# Patient Record
Sex: Female | Born: 1965 | Race: White | Hispanic: No | Marital: Married
Health system: Midwestern US, Academic
[De-identification: ages and names within clinical notes are randomized; demographics above are authoritative.]

---

## 2017-09-01 ENCOUNTER — Encounter: Admit: 2017-09-01 | Discharge: 2017-09-01 | Payer: BC Managed Care – PPO

## 2017-09-01 ENCOUNTER — Ambulatory Visit: Admit: 2017-09-01 | Discharge: 2017-09-01 | Payer: BC Managed Care – PPO

## 2017-09-01 DIAGNOSIS — Z87898 Personal history of other specified conditions: ICD-10-CM

## 2017-09-01 DIAGNOSIS — R351 Nocturia: ICD-10-CM

## 2017-09-01 DIAGNOSIS — N3946 Mixed incontinence: ICD-10-CM

## 2017-09-01 DIAGNOSIS — N952 Postmenopausal atrophic vaginitis: ICD-10-CM

## 2017-09-01 DIAGNOSIS — N8110 Cystocele, unspecified: ICD-10-CM

## 2017-09-01 DIAGNOSIS — N816 Rectocele: ICD-10-CM

## 2017-09-01 DIAGNOSIS — R3989 Other symptoms and signs involving the genitourinary system: Principal | ICD-10-CM

## 2017-09-01 DIAGNOSIS — R030 Elevated blood-pressure reading, without diagnosis of hypertension: ICD-10-CM

## 2017-09-01 DIAGNOSIS — N941 Unspecified dyspareunia: ICD-10-CM

## 2017-09-01 DIAGNOSIS — Z87440 Personal history of urinary (tract) infections: ICD-10-CM

## 2017-09-01 NOTE — Progress Notes
Pertinent Review of Systems:  - General ROS: DeniesN recent weight change above 10 pounds, DeniesPOS malaise  - HEENT ROS: DeniesN blurring of vision. DeniesN double vision, DeniesN glaucoma, DeniesN dry eyes  - Cardiovascular ROS: DeniesN chest pain. DeniesN palpitations currently. DeniesN orthopnea  - Respiratory ROS: DeniesN shortness of breath,  DeniesN cough,  DeniesN wheezing currently  - Gastrointestinal ROS: DeniesN constipation, DeniesN gastric reflux,  DeniesN blood in stool, DeniesN diarrhea, DeniesN Irritable bowel syndrome, DeniesN nausea, DeniesN vomiting, DeniesN abdominal pain  - Endocrine ROS: DeniesPOS polydipsia,  DeniesN excessive sweating. DeniesN hot flashes. DeniesPOS Thyroid disease  - Hematological and Lymphatic ROS: DeniesN easy bruisability , DeniesPOS current anemia, DeniesN adenopathy in the inguinal area  - Neurological ROS: DeniesN syncope, DeniesN numbness in lower extremities, DeniesPOS neuropathy. DeniesN shooting pain down the legs, DeniesPOS shooting pain in the lower back, DeniesPOS low back pain  - Musculoskeletal ROS: DeniesPOS Joint pain. DeniesN Edema in lower extremities. DeniesN fibromyalgia  - Psychological ROS: DeniesPOS depression, DeniesN anxiety, DeniesN thoughts of suicide, DeniesN thoughts of homicide, DeniesN recent seizure, DeniesN syncope   - Dermatological ROS: DeniesN eczema. DeniesN skin rashes in the groin area and other sites

## 2017-09-01 NOTE — Progress Notes
CHIEF COMPLAINT:   Chief Complaint   Patient presents with   ??? Bladder infection     New Patient        Natasha Guzman is a 51 y.o., female who presents for consultation   at the request of Dr Tommi Emery  for an opinion regarding (reason for visit): recurrent UTIs.     Pertinent History  Patient presents for chief complaint of recurrent UTIs which started in the past year.  She has had 5 UTIs in the past year.  She denies a lifetime history of recurrent UTIs.  She denies hematuria and dysuria.  She complains of suprapubic pain/pressure, rated 5 out of 10, occurring about every 4-6 weeks.  This pain is typically associated with UTI.  She was most recently on an antibiotic for UTI about 3 or 4 weeks ago.  She denies a history of pyelonephritis.   She voids 8-10 times per day.  Complains of nocturia twice nightly.  She drinks 1-2 servings of caffeine per day.  She complains of urinary leakage, occurring equally with stress and urgency.  She leaks urine a few times per month, usually drops at a time.  She does not wear incontinence pads daily.    Patient also complains of trouble emptying her bladder.  This started about 1 year ago.  She describes this as feeling of incomplete bladder emptying, requiring her to sit on the toilet for about 10 minutes and keep trying to urinate in order to feel as if she empties completely.  She denies intermittent stream, spraying, positional voiding.  She feels an increased awareness of bladder sensations.      She denies seeing or feeling of vaginal bulge.  She is not complaining of pelvic support problems.  She does not splint to urinate or defecate.     She denies diarrhea, constipation, pain with defecation, straining to defecate, fecal urgency, fecal incontinence and rectal prolapse. With defecation, she admits repeated wiping to feel clean.    She complains of dyspareunia, both with insertion and deep thrust.  She describes razor blade pain vaginally and periurethrally with intercourse and sometimes not associated with intercourse.  She admits vaginal dryness.  She admits a decreased desire for intercourse.  Of note, she states her PCP told her her vaginal canal was shortened.    She has seen one other physician for these complaints.  She has not had any treatment for these complaints.    Surgical history  Gastric bypass  Bilateral shoulder arthroscopy  Bilateral carpal tunnel release  Tubal ligation 1990  TAH plus BSO in 1995 (for cysts of ovaries, fallopian tubes and menorrhagia)    Gynecologic history  G4P4, vaginal deliveries, had episiotomy and laceration.    Last Pap smear 3-4 weeks ago.  Patient states there was an infection.  She was given antibiotics unsure which one.   Last colonoscopy November 2018 was normal      Pertinent Review of Systems:  - General ROS: DeniesN recent weight change above 10 pounds, DeniesPOS malaise  - HEENT ROS: DeniesN blurring of vision. DeniesN double vision, DeniesN glaucoma, DeniesN dry eyes  - Cardiovascular ROS: DeniesN chest pain. DeniesN palpitations currently. DeniesN orthopnea  - Respiratory ROS: DeniesN shortness of breath,  DeniesN cough,  DeniesN wheezing currently  - Gastrointestinal ROS: DeniesN constipation, DeniesN gastric reflux,  DeniesN blood in stool, DeniesN diarrhea, DeniesN Irritable bowel syndrome, DeniesN nausea, DeniesN vomiting, DeniesN abdominal pain  - Endocrine ROS: POS polydipsia,  DeniesN  excessive sweating. DeniesN hot flashes. POS Thyroid disease  - Hematological and Lymphatic ROS: DeniesN easy bruisability , POS current anemia, DeniesN adenopathy in the inguinal area  - Neurological ROS: DeniesN syncope, DeniesN numbness in lower extremities, DeniesPOS neuropathy. DeniesN shooting pain down the legs, DeniesPOS shooting pain in the lower back, DeniesPOS low back pain  - Musculoskeletal ROS: DeniesPOS Joint pain. DeniesN Edema in lower extremities. DeniesN fibromyalgia  - Psychological ROS: DeniesPOS depression, DeniesN anxiety, DeniesN thoughts of suicide, DeniesN thoughts of homicide, DeniesN recent seizure, DeniesN syncope   - Dermatological ROS: DeniesN eczema. DeniesN skin rashes in the groin area and other sites        ___________________________________________________________________________        ALLERGIES:  Keflex [cephalexin]    History reviewed. No pertinent past medical history.    Past Surgical History:   Procedure Laterality Date   ??? HX HYSTERECTOMY     ??? OOPHORECTOMY         Family History   Problem Relation Age of Onset   ??? Heart Failure Mother    ??? Heart Failure Father        Social History     Socioeconomic History   ??? Marital status: Married     Spouse name: Not on file   ??? Number of children: Not on file   ??? Years of education: Not on file   ??? Highest education level: Not on file   Social Needs   ??? Financial resource strain: Not on file   ??? Food insecurity - worry: Not on file   ??? Food insecurity - inability: Not on file   ??? Transportation needs - medical: Not on file   ??? Transportation needs - non-medical: Not on file   Occupational History   ??? Not on file   Tobacco Use   ??? Smoking status: Not on file   Substance and Sexual Activity   ??? Alcohol use: Not on file   ??? Drug use: Not on file   ??? Sexual activity: Yes   Other Topics Concern   ??? Not on file   Social History Narrative   ??? Not on file         Current Outpatient Medications:   ???  levothyroxine (SYNTHROID) 125 mcg tablet, Take 125 mcg by mouth daily 30 minutes before breakfast., Disp: , Rfl:   ???  other medication, 1 Dose. CBD Oil 10 drops BID, Disp: , Rfl:   ???  venlafaxine XR (EFFEXOR XR) 150 mg capsule, Take 150 mg by mouth daily. Take with food., Disp: , Rfl:       ___________________________________________________________________________        OBJECTIVE:    BP (!) 146/98 (BP Source: Arm, Left Upper, Patient Position: Sitting)  - Pulse 110  - Ht 172.7 cm (68)  - Wt 10.3 kg (22 lb 12.8 oz)  - BMI 3.47 kg/m???   BP recheck: 149/98, pulse 74    Physical Examination:  General appearance: not in acute distress, walking with normal gait  Mental status :oriented to time, place and person; affect and mood appropriate; normal interaction  Cardiovascular: normal heart rate, bilateral LE pulses palpable  Chest / Respiratory: normal respiratory effort, unlabored breathing   Back: No spine tenderness, no SI joint tenderness, No CVAT   Neck: No thryromegally, no adenopathy  Gastrointestinal / Abdomen: no masses, no rebound, no tenderness, soft, nondistended, no hernias,  incisions without tenderness   Extremities: no gross deformities, normal joint mobility  Neruologic: no asymmetric weakness in LE, no abnormalities in sensation in LE  Peripheral vascular system: no cyanosis, no edema in LE, extremities warm  Dermatological: no perineal nevi or rashes noted   Genitourinary:      Voids (ml)  Void (spontaneous)       PVR by bladder scan 57 mL        Urinalysis  Leuks                 pos      Nitrites                 neg      Heme                 neg                      (one choice per column)  Ext. Genitalia      Lesions noted No    Appearance consistent with age Yes   Vagina       Discharge Present No    Mucosa Atrophic Yes    Tissue Friable No    Tissue Tender To Palpation. No    Bladder      Masses Noted No    Tender to Palpation See below   Urethra      Midline Yes    Normal Size Yes    Urethral Tenderness On Palpation See below    Urethral Discharge Noted No    Any Lesions Noted. No    Uterus        Normal Size S/P HYST    Mobile S/P HYST    Tender S/P HYST   Adnexa       Masses palpated  S/P BSO    Tenderness noted S/P BSO   Bimanual       Masses palpated  None   Perineum       visually intact. Yes    Anus  Rectocele reproducible with digital exam     External Hemorrhoids none    Anal tone IAS 4/5, EAS 4/5   Myofascial Tenderness Rt vulva  0/10       Rt levator  0/10       Rt illiococcygeus  7-8/10       Rt obturator internus  10/10       Cuff/cervix 8-10 /10       Anterior vagina (bladder)  radiates to RLQ/10       Anterior vagina (urethra)  0/10       Lt vulva  0/10       Lt levator  0/10       Lt illiococcygeus  5/10       Lt obturator internus  7-8/10       Posterior vagina  0/10                                 Continence Stress Test (supine stress test)  neg      Pelvic Organ Prolapse Quantification (POP-Q):    Aa: -0.5 Ba: -0.5 C: -7   GH: 3.5 PB: 3 TVL: 8.5   Ap: -0.5 Bp: -0.5 D: hyst         Compartment Stages  Anterior  II   Apical 0   Posterior  II           IMPRESSION/PLAN:  51 yo female,  s/p hysterectomy and BSO with the following:   - Self-reported recurrent UTIs (pt will try to obtain records and bring to next visit)   - Urinary frequency   - Mixed urinary incontinence   - Voiding dysfunction with difficulty emptying her bladder to completion (57 mL PVR today)  - Nocturia   - Dyspareunia  - Urine dipstick positive for leukocytes   - Vulvovaginal atrophy  - Stage II vaginal wall prolapse    Outlet obstruction constipation symptoms  - Myofascial pelvic pain  - Elevated BP without diagnosis of HTN     1. Cystourethroscopy to further evaluation bladder symptoms  2. Follow-up urine culture.   3. Estrace vaginal cream 1 g twice weekly at night.   4. Discussed PFPT for dyspareunia, myofascial pelvic pain, and early prolapse. Pt will consider and notify office she would like referral. A  5. Discussed TPIs for myofascial pelvic pain. May consider if future if needed. Will treat vulvovaginal atrophy and see if dyspareunia improves.   6. Pt has BP cuff at home. Instructed to monitor over next few days. Follow up with PCP regarding elevated BP.       ___________________________________________________________________________        SUGGESTED FOLLOW-UP:  in 4 weeks     Letter sent to consulting physician - Yes ___________________________________________________________________________            Physician   Robbie Lis, M.D.     Division of Female Pelvic Medicine and Reconstructive Surgery (Urogynecology)  Department of Obstetrics & Gynecology  [facility]    Nurse Office [phone]  Appointments [phone]  Surgical Scheduling [phone]  Fax [phone]    Office Locations   Site 1- Medical Office [place], 5th floor, [facility], [place]  Site 2- [facility], [address], [place]        Date Of Consult   September 01, 2017

## 2017-09-02 MED ORDER — ESTRADIOL 0.01 % (0.1 MG/GRAM) VA CREA
1 g | VAGINAL | 6 refills | 43.00000 days | Status: AC
Start: 2017-09-02 — End: 2017-09-02

## 2017-09-02 MED ORDER — ESTRADIOL 0.01 % (0.1 MG/GRAM) VA CREA
1 g | VAGINAL | 6 refills | 43.00000 days | Status: AC
Start: 2017-09-02 — End: ?

## 2017-09-02 NOTE — Procedures
BLADDER SCAN US FOR POST VOID RESIDUAL    The patient was asked to void spontaneously.      Within 15 min of voiding, the Portascan 3D bladder scanner was used to obtain a residual volume of 57 mL.

## 2017-09-03 LAB — CULTURE-URINE W/SENSITIVITY

## 2017-09-05 ENCOUNTER — Encounter: Admit: 2017-09-05 | Discharge: 2017-09-05 | Payer: BC Managed Care – PPO

## 2017-09-05 NOTE — Telephone Encounter
-----   Message from Melissa Huggins, MD sent at 09/03/2017  9:44 PM CST -----  Negative urine culture.  Please contact patient with normal results.

## 2017-09-05 NOTE — Telephone Encounter
Spoke to pt. Relayed negative urine culture results. Pt v/u and stated she completed a survey and was very happy with her apt with [employer], PA-C. Pt appreciative and had no further questions/concerns.

## 2017-10-06 ENCOUNTER — Ambulatory Visit: Admit: 2017-10-06 | Discharge: 2017-10-06 | Payer: BC Managed Care – PPO

## 2017-10-06 ENCOUNTER — Encounter: Admit: 2017-10-06 | Discharge: 2017-10-06 | Payer: BC Managed Care – PPO

## 2017-10-06 DIAGNOSIS — Z01812 Encounter for preprocedural laboratory examination: Principal | ICD-10-CM

## 2017-10-06 DIAGNOSIS — N816 Rectocele: ICD-10-CM

## 2017-10-06 DIAGNOSIS — N8110 Cystocele, unspecified: ICD-10-CM

## 2017-10-06 DIAGNOSIS — N3946 Mixed incontinence: ICD-10-CM

## 2017-10-06 DIAGNOSIS — Z87440 Personal history of urinary (tract) infections: ICD-10-CM

## 2017-10-06 MED ORDER — METHENAMINE HIPPURATE 1 GRAM PO TAB
1 g | ORAL_TABLET | Freq: Two times a day (BID) | ORAL | 11 refills | 90.00000 days | Status: AC
Start: 2017-10-06 — End: ?

## 2017-10-06 MED ORDER — AMOXICILLIN-POT CLAVULANATE 500-125 MG PO TAB
1 | ORAL_TABLET | ORAL | 0 refills | 7.00000 days | Status: AC
Start: 2017-10-06 — End: ?

## 2017-10-06 MED ORDER — CIPROFLOXACIN HCL 500 MG PO TAB
500 mg | Freq: Once | ORAL | 0 refills | Status: AC
Start: 2017-10-06 — End: ?

## 2021-11-03 ENCOUNTER — Encounter: Admit: 2021-11-03 | Discharge: 2021-11-03 | Payer: BC Managed Care – PPO

## 2022-06-21 IMAGING — US US breast RT [ID]
1 series · 14 of 16 positions shown · non-contrast
Comparison: none

Exam: Complete right breast ultrasound.

Indications: Right breast asymmetry/mass.
TECHNIQUE: Routine complete right breast ultrasound with
evaluation of 4
quadrants, the retroareolar region, and axilla.

[Series 1: us breast right (id) · 14 of 16 slices shown]
[im 1/16]
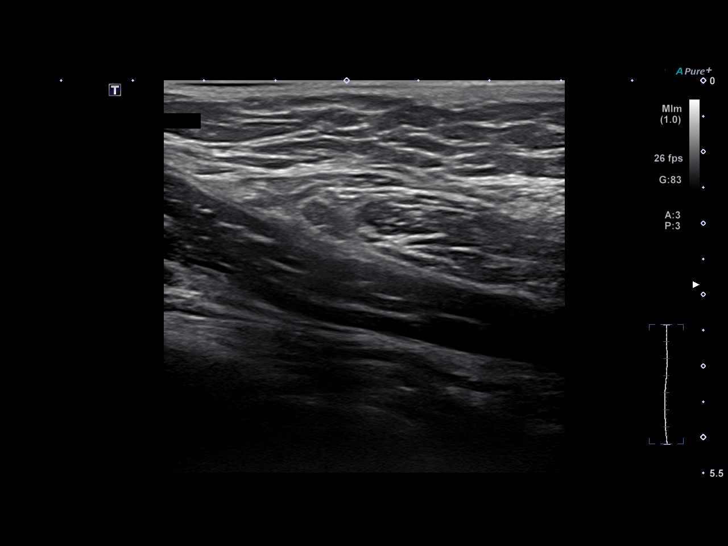
[im 2/16]
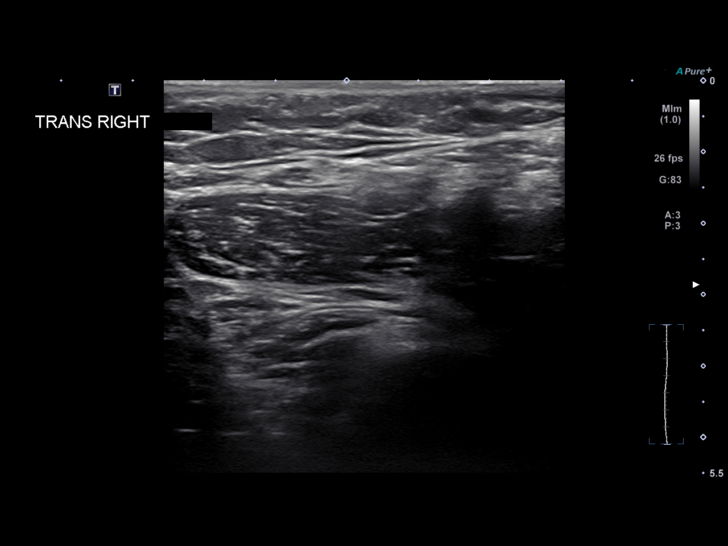
[im 3/16]
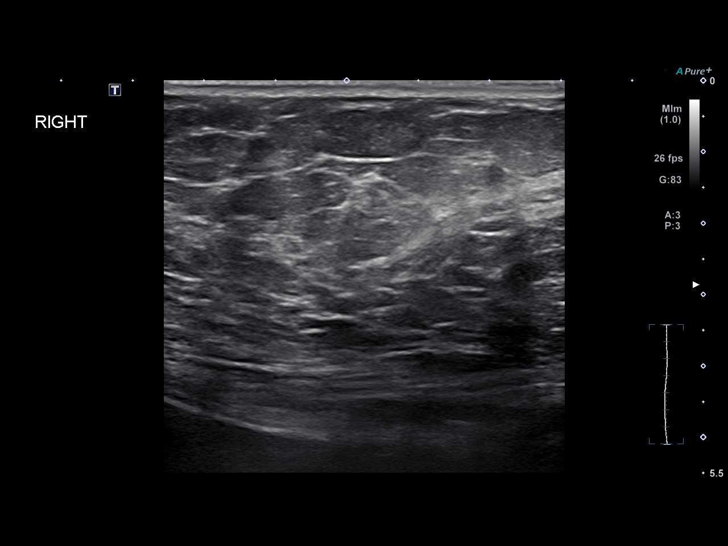
[im 5/16]
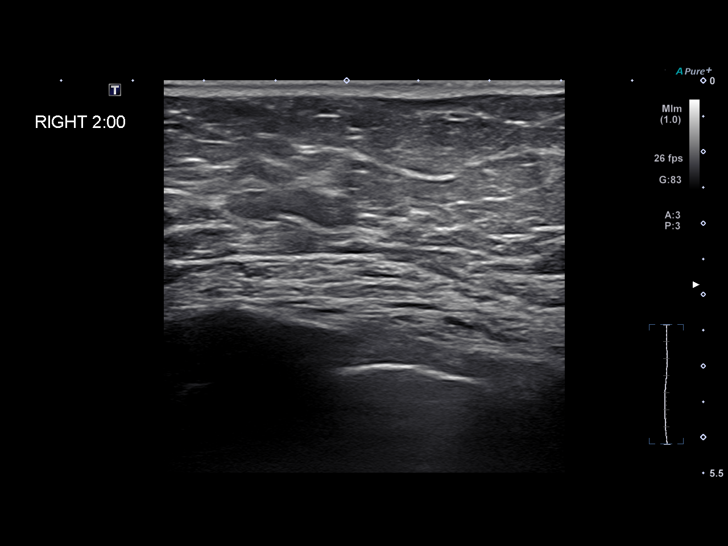
[im 6/16]
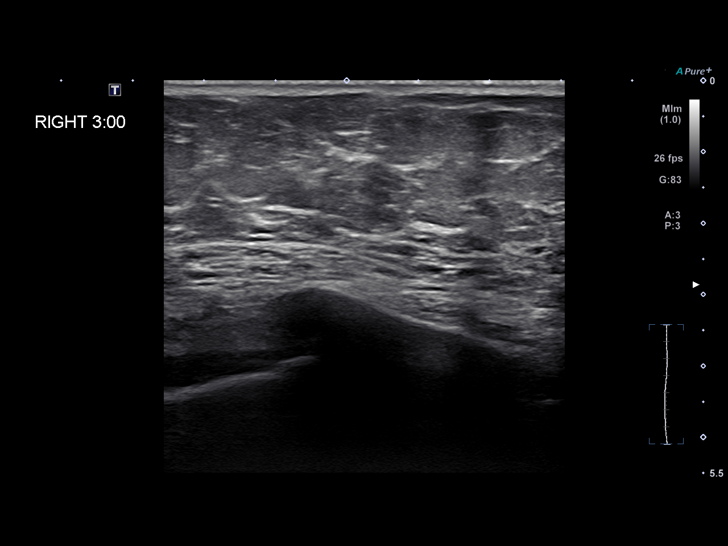
[im 7/16]
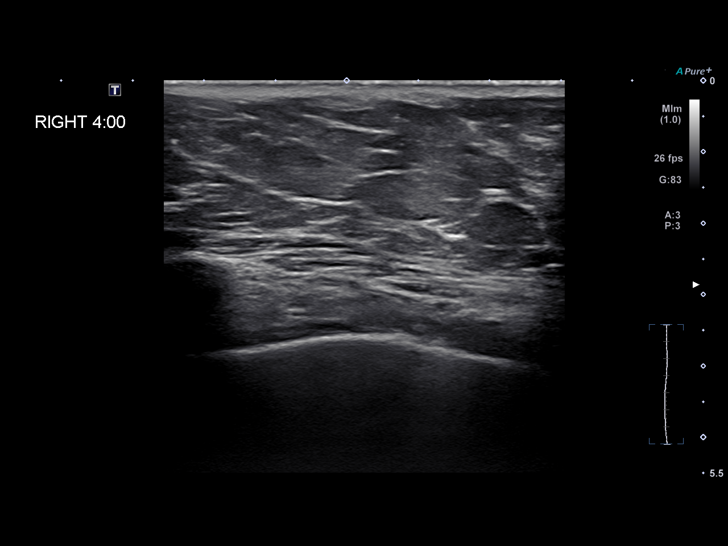
[im 8/16]
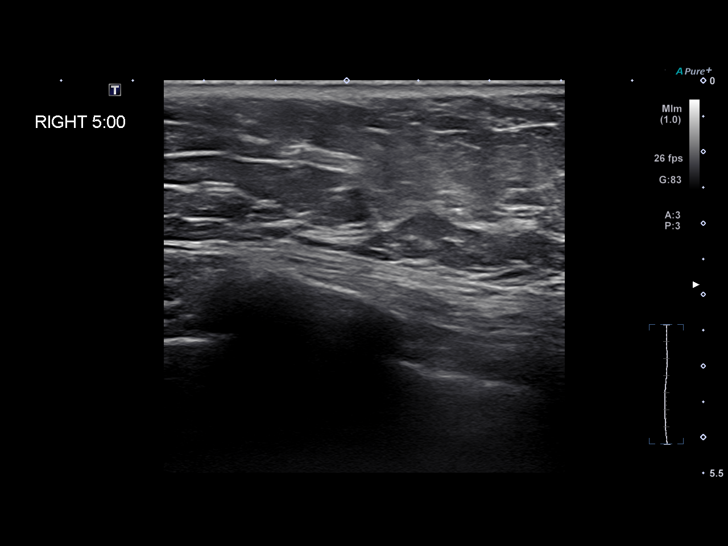
[im 9/16]
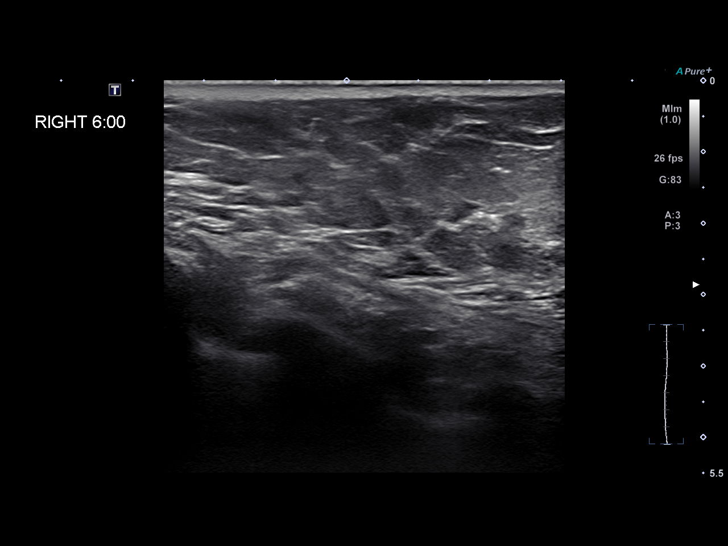
[im 10/16]
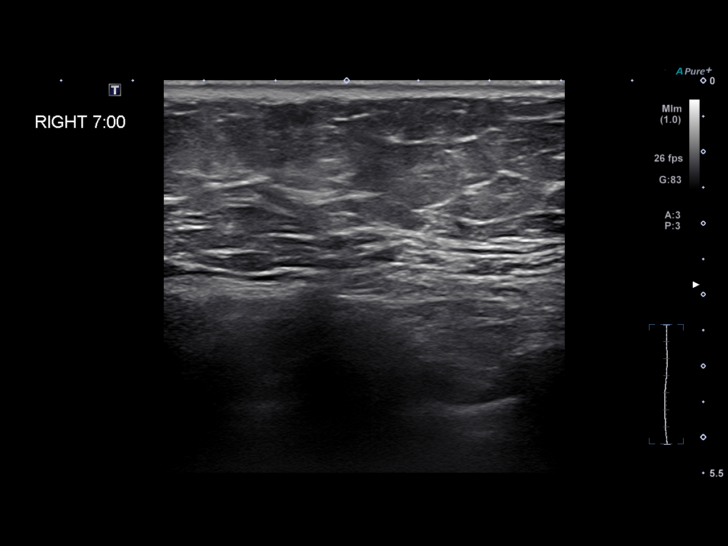
[im 11/16]
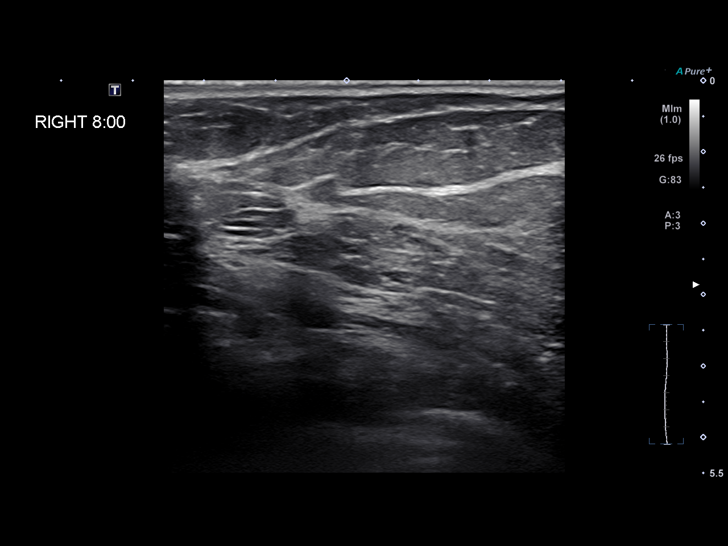
[im 13/16]
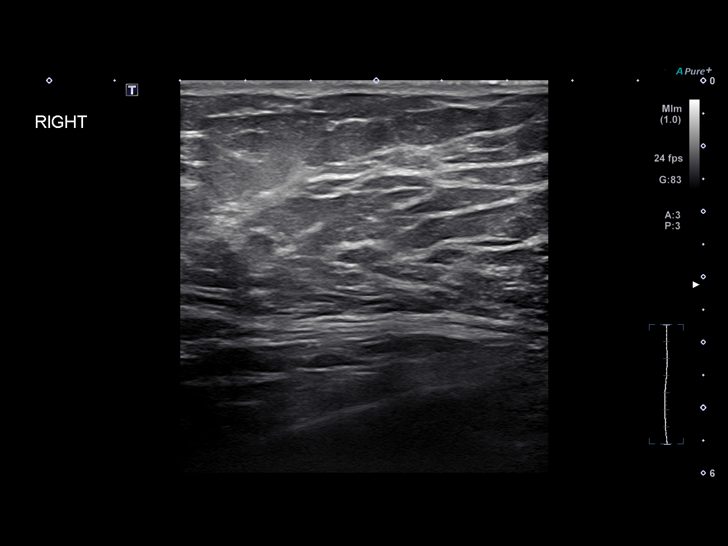
[im 14/16]
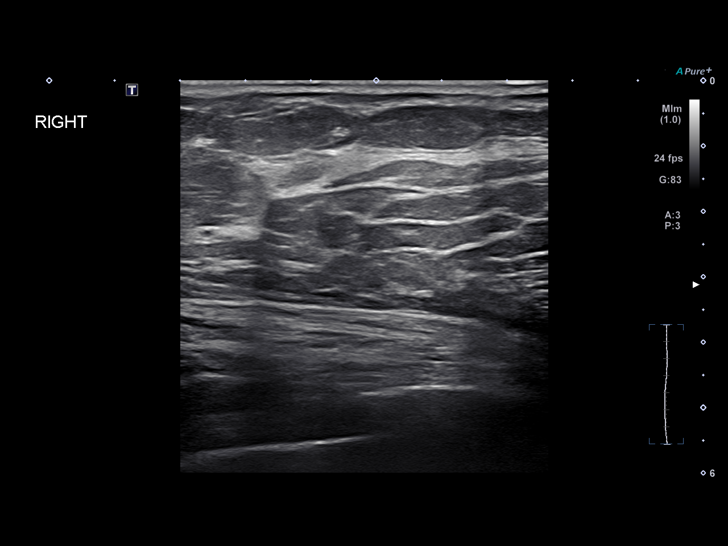
[im 15/16]
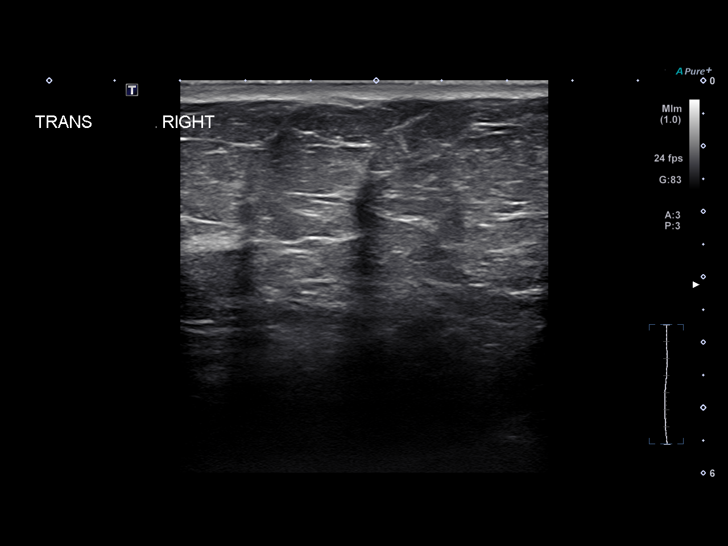
[im 16/16]
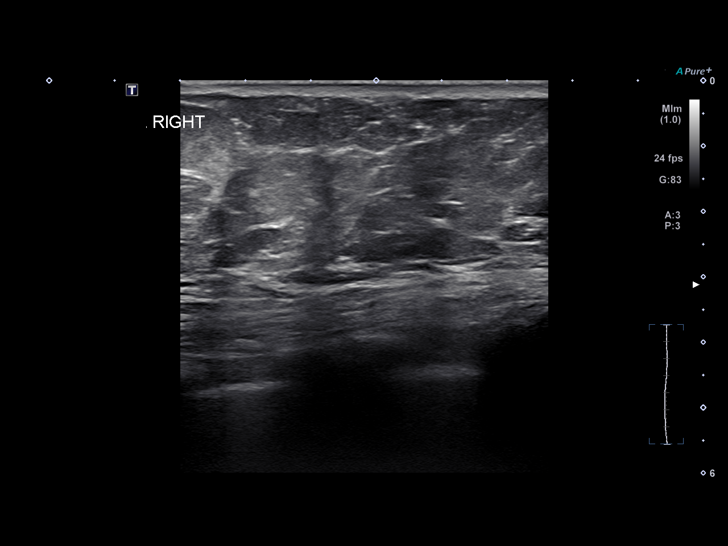

[14 of 16 positions shown; findings below may reference images not displayed]

FINDINGS: There are no solid or cystic masses identified within the
breast.

The axilla is unremarkable.
IMPRESSION: Unremarkable right breast ultrasound. Continued annual
screening mammography
recommended.

BI RADS 1- Negative

[id number]
